# Patient Record
Sex: Male | Born: 1970 | Race: Black or African American | Hispanic: No | Smoking: Current every day smoker
Health system: Southern US, Community
[De-identification: ages and names within clinical notes are randomized; demographics above are authoritative.]

## PROBLEM LIST (undated history)

## (undated) DIAGNOSIS — M25569 Pain in unspecified knee: Secondary | ICD-10-CM

---

## 1998-07-15 ENCOUNTER — Emergency Department (HOSPITAL_COMMUNITY): Admission: EM | Admit: 1998-07-15 | Discharge: 1998-07-15 | Payer: Self-pay | Admitting: Emergency Medicine

## 1998-07-20 ENCOUNTER — Emergency Department (HOSPITAL_COMMUNITY): Admission: EM | Admit: 1998-07-20 | Discharge: 1998-07-20 | Payer: Self-pay

## 2003-12-21 ENCOUNTER — Emergency Department (HOSPITAL_COMMUNITY): Admission: EM | Admit: 2003-12-21 | Discharge: 2003-12-21 | Payer: Self-pay | Admitting: Emergency Medicine

## 2005-02-17 ENCOUNTER — Emergency Department (HOSPITAL_COMMUNITY): Admission: EM | Admit: 2005-02-17 | Discharge: 2005-02-17 | Payer: Self-pay | Admitting: Emergency Medicine

## 2009-03-18 ENCOUNTER — Emergency Department (HOSPITAL_COMMUNITY): Admission: EM | Admit: 2009-03-18 | Discharge: 2009-03-18 | Payer: Self-pay | Admitting: Emergency Medicine

## 2009-09-12 ENCOUNTER — Emergency Department (HOSPITAL_COMMUNITY): Admission: EM | Admit: 2009-09-12 | Discharge: 2009-09-12 | Payer: Self-pay | Admitting: Emergency Medicine

## 2010-12-28 LAB — CBC
Hemoglobin: 12.3 g/dL — ABNORMAL LOW (ref 13.0–17.0)
MCHC: 31.4 g/dL (ref 30.0–36.0)
MCV: 78.7 fL (ref 78.0–100.0)
Platelets: 238 10*3/uL (ref 150–400)
RDW: 15.1 % (ref 11.5–15.5)
WBC: 7.2 10*3/uL (ref 4.0–10.5)

## 2010-12-28 LAB — HEPATIC FUNCTION PANEL: Bilirubin, Direct: 0.1 mg/dL (ref 0.0–0.3)

## 2010-12-28 LAB — DIFFERENTIAL
Eosinophils Absolute: 0.2 10*3/uL (ref 0.0–0.7)
Eosinophils Relative: 3 % (ref 0–5)
Lymphocytes Relative: 40 % (ref 12–46)
Monocytes Absolute: 0.7 10*3/uL (ref 0.1–1.0)
Monocytes Relative: 9 % (ref 3–12)

## 2010-12-28 LAB — POCT I-STAT, CHEM 8
Calcium, Ion: 1.19 mmol/L (ref 1.12–1.32)
Glucose, Bld: 77 mg/dL (ref 70–99)
Hemoglobin: 15 g/dL (ref 13.0–17.0)
TCO2: 26 mmol/L (ref 0–100)

## 2011-01-22 ENCOUNTER — Emergency Department (HOSPITAL_COMMUNITY): Payer: Self-pay

## 2011-01-22 ENCOUNTER — Emergency Department (HOSPITAL_COMMUNITY)
Admission: EM | Admit: 2011-01-22 | Discharge: 2011-01-22 | Disposition: A | Discharge status: Home / Self Care | Payer: Self-pay | Attending: Emergency Medicine | Admitting: Emergency Medicine

## 2011-01-22 DIAGNOSIS — S6990XA Unspecified injury of unspecified wrist, hand and finger(s), initial encounter: Secondary | ICD-10-CM | POA: Insufficient documentation

## 2011-01-22 DIAGNOSIS — W19XXXA Unspecified fall, initial encounter: Secondary | ICD-10-CM | POA: Insufficient documentation

## 2011-01-22 DIAGNOSIS — S59909A Unspecified injury of unspecified elbow, initial encounter: Secondary | ICD-10-CM | POA: Insufficient documentation

## 2011-01-22 DIAGNOSIS — M79609 Pain in unspecified limb: Secondary | ICD-10-CM | POA: Insufficient documentation

## 2011-01-22 DIAGNOSIS — M25579 Pain in unspecified ankle and joints of unspecified foot: Secondary | ICD-10-CM | POA: Insufficient documentation

## 2011-01-22 DIAGNOSIS — Y92009 Unspecified place in unspecified non-institutional (private) residence as the place of occurrence of the external cause: Secondary | ICD-10-CM | POA: Insufficient documentation

## 2011-01-22 DIAGNOSIS — M25539 Pain in unspecified wrist: Secondary | ICD-10-CM | POA: Insufficient documentation

## 2011-03-16 ENCOUNTER — Emergency Department (HOSPITAL_COMMUNITY): Payer: Self-pay

## 2011-03-16 ENCOUNTER — Emergency Department (HOSPITAL_COMMUNITY)
Admission: EM | Admit: 2011-03-16 | Discharge: 2011-03-16 | Disposition: A | Discharge status: Home / Self Care | Payer: Self-pay | Attending: Emergency Medicine | Admitting: Emergency Medicine

## 2011-03-16 DIAGNOSIS — M25579 Pain in unspecified ankle and joints of unspecified foot: Secondary | ICD-10-CM | POA: Insufficient documentation

## 2011-03-16 DIAGNOSIS — S838X9A Sprain of other specified parts of unspecified knee, initial encounter: Secondary | ICD-10-CM | POA: Insufficient documentation

## 2011-03-16 DIAGNOSIS — M25569 Pain in unspecified knee: Secondary | ICD-10-CM | POA: Insufficient documentation

## 2011-03-22 ENCOUNTER — Emergency Department (HOSPITAL_COMMUNITY)
Admission: EM | Admit: 2011-03-22 | Discharge: 2011-03-22 | Disposition: A | Discharge status: Home / Self Care | Payer: Self-pay | Attending: Emergency Medicine | Admitting: Emergency Medicine

## 2011-03-22 DIAGNOSIS — IMO0002 Reserved for concepts with insufficient information to code with codable children: Secondary | ICD-10-CM | POA: Insufficient documentation

## 2011-03-22 DIAGNOSIS — M25569 Pain in unspecified knee: Secondary | ICD-10-CM | POA: Insufficient documentation

## 2011-03-22 DIAGNOSIS — X58XXXA Exposure to other specified factors, initial encounter: Secondary | ICD-10-CM | POA: Insufficient documentation

## 2011-04-27 ENCOUNTER — Ambulatory Visit: Payer: Self-pay | Attending: Orthopedic Surgery

## 2011-06-08 ENCOUNTER — Emergency Department (HOSPITAL_COMMUNITY)
Admission: EM | Admit: 2011-06-08 | Discharge: 2011-06-08 | Disposition: A | Discharge status: Home / Self Care | Payer: Self-pay | Attending: Emergency Medicine | Admitting: Emergency Medicine

## 2011-06-08 ENCOUNTER — Emergency Department (HOSPITAL_COMMUNITY): Payer: Self-pay

## 2011-06-08 DIAGNOSIS — X58XXXA Exposure to other specified factors, initial encounter: Secondary | ICD-10-CM | POA: Insufficient documentation

## 2011-06-08 DIAGNOSIS — K047 Periapical abscess without sinus: Secondary | ICD-10-CM | POA: Insufficient documentation

## 2011-06-08 DIAGNOSIS — R6884 Jaw pain: Secondary | ICD-10-CM | POA: Insufficient documentation

## 2011-06-08 DIAGNOSIS — S025XXA Fracture of tooth (traumatic), initial encounter for closed fracture: Secondary | ICD-10-CM | POA: Insufficient documentation

## 2011-11-03 ENCOUNTER — Emergency Department (HOSPITAL_COMMUNITY)
Admission: EM | Admit: 2011-11-03 | Discharge: 2011-11-04 | Disposition: A | Discharge status: Home / Self Care | Payer: Self-pay | Attending: Emergency Medicine | Admitting: Emergency Medicine

## 2011-11-03 ENCOUNTER — Emergency Department (HOSPITAL_COMMUNITY): Payer: Self-pay

## 2011-11-03 ENCOUNTER — Encounter (HOSPITAL_COMMUNITY): Payer: Self-pay

## 2011-11-03 DIAGNOSIS — S0990XA Unspecified injury of head, initial encounter: Secondary | ICD-10-CM | POA: Insufficient documentation

## 2011-11-03 DIAGNOSIS — M25519 Pain in unspecified shoulder: Secondary | ICD-10-CM | POA: Insufficient documentation

## 2011-11-03 DIAGNOSIS — S0003XA Contusion of scalp, initial encounter: Secondary | ICD-10-CM | POA: Insufficient documentation

## 2011-11-03 DIAGNOSIS — M542 Cervicalgia: Secondary | ICD-10-CM | POA: Insufficient documentation

## 2011-11-03 DIAGNOSIS — R209 Unspecified disturbances of skin sensation: Secondary | ICD-10-CM | POA: Insufficient documentation

## 2011-11-03 DIAGNOSIS — F10929 Alcohol use, unspecified with intoxication, unspecified: Secondary | ICD-10-CM

## 2011-11-03 DIAGNOSIS — F17200 Nicotine dependence, unspecified, uncomplicated: Secondary | ICD-10-CM | POA: Insufficient documentation

## 2011-11-03 DIAGNOSIS — R51 Headache: Secondary | ICD-10-CM | POA: Insufficient documentation

## 2011-11-03 DIAGNOSIS — F1010 Alcohol abuse, uncomplicated: Secondary | ICD-10-CM | POA: Insufficient documentation

## 2011-11-03 DIAGNOSIS — IMO0002 Reserved for concepts with insufficient information to code with codable children: Secondary | ICD-10-CM | POA: Insufficient documentation

## 2011-11-03 DIAGNOSIS — S0100XA Unspecified open wound of scalp, initial encounter: Secondary | ICD-10-CM | POA: Insufficient documentation

## 2011-11-03 DIAGNOSIS — M549 Dorsalgia, unspecified: Secondary | ICD-10-CM | POA: Insufficient documentation

## 2011-11-03 LAB — URINALYSIS, ROUTINE W REFLEX MICROSCOPIC
Glucose, UA: NEGATIVE mg/dL
Hgb urine dipstick: NEGATIVE
Ketones, ur: NEGATIVE mg/dL
Leukocytes, UA: NEGATIVE
pH: 6 (ref 5.0–8.0)

## 2011-11-03 LAB — CBC
Hemoglobin: 12.8 g/dL — ABNORMAL LOW (ref 13.0–17.0)
MCHC: 32.4 g/dL (ref 30.0–36.0)
RBC: 5.23 MIL/uL (ref 4.22–5.81)
RDW: 15.2 % (ref 11.5–15.5)
WBC: 8.1 10*3/uL (ref 4.0–10.5)

## 2011-11-03 LAB — DIFFERENTIAL
Basophils Absolute: 0 10*3/uL (ref 0.0–0.1)
Eosinophils Absolute: 0.2 10*3/uL (ref 0.0–0.7)
Eosinophils Relative: 2 % (ref 0–5)
Lymphocytes Relative: 44 % (ref 12–46)
Neutrophils Relative %: 50 % (ref 43–77)

## 2011-11-03 LAB — COMPREHENSIVE METABOLIC PANEL
ALT: 14 U/L (ref 0–53)
Alkaline Phosphatase: 43 U/L (ref 39–117)
BUN: 10 mg/dL (ref 6–23)
Chloride: 106 mEq/L (ref 96–112)
GFR calc Af Amer: >90 mL/min (ref >90)
Glucose, Bld: 88 mg/dL (ref 70–99)
Potassium: 3.5 mEq/L (ref 3.5–5.1)
Sodium: 143 mEq/L (ref 135–145)
Total Bilirubin: 0.2 mg/dL — ABNORMAL LOW (ref 0.3–1.2)
Total Protein: 7.1 g/dL (ref 6.0–8.3)

## 2011-11-03 LAB — PROTIME-INR: Prothrombin Time: 12.6 seconds (ref 11.6–15.2)

## 2011-11-03 MED ORDER — IOHEXOL 300 MG/ML  SOLN
100.0000 mL | Freq: Once | INTRAMUSCULAR | Status: AC | PRN
  Administered 2011-11-03: 100 mL via INTRAVENOUS

## 2011-11-03 MED ORDER — SODIUM CHLORIDE 0.9 % IV BOLUS (SEPSIS)
1000.0000 mL | Freq: Once | INTRAVENOUS | Status: AC
  Administered 2011-11-03: 1000 mL via INTRAVENOUS

## 2011-11-03 MED ORDER — TETANUS-DIPHTH-ACELL PERTUSSIS 5-2.5-18.5 LF-MCG/0.5 IM SUSP
0.5000 mL | Freq: Once | INTRAMUSCULAR | Status: AC
  Administered 2011-11-03: 0.5 mL via INTRAMUSCULAR
  Filled 2011-11-03: qty 0.5

## 2011-11-03 MED ORDER — HYDROCODONE-ACETAMINOPHEN 5-325 MG PO TABS
ORAL_TABLET | ORAL | Status: AC
  Filled 2011-11-03: qty 2

## 2011-11-03 MED ORDER — FENTANYL CITRATE 0.05 MG/ML IJ SOLN
100.0000 ug | Freq: Once | INTRAMUSCULAR | Status: AC
  Administered 2011-11-03: 100 ug via INTRAVENOUS
  Filled 2011-11-03: qty 2

## 2011-11-03 MED ORDER — HYDROCODONE-ACETAMINOPHEN 5-325 MG PO TABS
2.0000 | ORAL_TABLET | Freq: Once | ORAL | Status: AC
  Administered 2011-11-03: 2 via ORAL

## 2011-11-03 MED ORDER — ONDANSETRON HCL 4 MG/2ML IJ SOLN
4.0000 mg | Freq: Once | INTRAMUSCULAR | Status: AC
  Administered 2011-11-03: 4 mg via INTRAVENOUS
  Filled 2011-11-03: qty 2

## 2011-11-03 NOTE — ED Notes (Signed)
Pt ambulated without assistance and ginger ale given
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
## 2011-11-03 NOTE — ED Notes (Signed)
Lacerations noted to mid-forehead and R side of forehead, hematoma with laceration noted to occipital area.  Large swelling noted to L scapula.
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
## 2011-11-03 NOTE — ED Notes (Signed)
[employer] at bedside to speak with pt.  

## 2012-04-03 ENCOUNTER — Emergency Department (HOSPITAL_COMMUNITY)
Admission: EM | Admit: 2012-04-03 | Discharge: 2012-04-03 | Disposition: A | Discharge status: Home / Self Care | Payer: Self-pay | Attending: Emergency Medicine | Admitting: Emergency Medicine

## 2012-04-03 ENCOUNTER — Encounter (HOSPITAL_COMMUNITY): Payer: Self-pay | Admitting: *Deleted

## 2012-04-03 DIAGNOSIS — F17200 Nicotine dependence, unspecified, uncomplicated: Secondary | ICD-10-CM | POA: Insufficient documentation

## 2012-04-03 DIAGNOSIS — Z4802 Encounter for removal of sutures: Secondary | ICD-10-CM | POA: Insufficient documentation

## 2012-04-03 NOTE — ED Provider Notes (Signed)
Medical screening examination/treatment/procedure(s) were performed by non-physician practitioner and as supervising physician I was immediately available for consultation/collaboration.  Doug Sou, MD 04/03/12 [phone]
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
## 2012-04-03 NOTE — ED Provider Notes (Signed)
History     CSN: [id number]  Arrival date & time 04/03/12  1125   First MD Initiated Contact with Patient 04/03/12 1318      Chief Complaint  Patient presents with  . Suture / Staple Removal    (Consider location/radiation/quality/duration/timing/severity/associated sxs/prior treatment) Patient is a 41 y.o. male presenting with suture removal. The history is provided by the patient.  Suture / Staple Removal  Treated in ED: 5 months ago. There has been no treatment since the wound repair. Fever duration: no fever. There has been no drainage from the wound. There is no redness present. There is no swelling present. The pain has no pain.    History reviewed. No pertinent past medical history.  History reviewed. No pertinent past surgical history.  No family history on file.  History  Substance Use Topics  . Smoking status: Current Everyday Smoker -- 0.5 packs/day  . Smokeless tobacco: Not on file  . Alcohol Use: 25.2 oz/week    42 Cans of beer per week      Review of Systems  Constitutional: Negative for fever and chills.  Gastrointestinal: Negative for nausea and vomiting.  Skin: Negative for color change.    Allergies  Review of patient's allergies indicates no known allergies.  Home Medications   Current Outpatient Rx  Name Route Sig Dispense Refill  . ACETAMINOPHEN 500 MG PO TABS Oral Take 1,000 mg by mouth every 6 (six) hours as needed. For pain      BP 136/66  Pulse 75  Temp 98.4 F (36.9 C) (Oral)  Resp 19  Wt 165 lb (74.844 kg)  SpO2 100%  Physical Exam  Nursing note and vitals reviewed. Constitutional: He appears well-developed and well-nourished.  HENT:  Head: Normocephalic and atraumatic.  Neck: Normal range of motion. Neck supple.  Cardiovascular: Normal rate, regular rhythm and normal heart sounds.   Pulmonary/Chest: Effort normal and breath sounds normal.  Neurological: He is alert.  Skin: Skin is warm and dry.     Psychiatric: He has  a normal mood and affect.    ED Course  Procedures (including critical care time)  Labs Reviewed - No data to display No results found.   No diagnosis found.    MDM  Patient presenting for staple removal.  Staples removed without difficulty.  No surrounding erythema, edema, or induration.  Area nontender to palpation.  Patient afebrile.  Therefore, patient discharged home.        Pascal Lux Glenaire, PA-C 04/03/12 1537

## 2014-08-08 ENCOUNTER — Emergency Department (HOSPITAL_COMMUNITY)
Admission: EM | Admit: 2014-08-08 | Discharge: 2014-08-08 | Disposition: A | Discharge status: Home / Self Care | Payer: Self-pay | Attending: Emergency Medicine | Admitting: Emergency Medicine

## 2014-08-08 ENCOUNTER — Encounter (HOSPITAL_COMMUNITY): Payer: Self-pay | Admitting: Emergency Medicine

## 2014-08-08 DIAGNOSIS — Z720 Tobacco use: Secondary | ICD-10-CM | POA: Insufficient documentation

## 2014-08-08 DIAGNOSIS — M25562 Pain in left knee: Secondary | ICD-10-CM | POA: Insufficient documentation

## 2014-08-08 DIAGNOSIS — M1990 Unspecified osteoarthritis, unspecified site: Secondary | ICD-10-CM | POA: Insufficient documentation

## 2014-08-08 HISTORY — DX: Pain in unspecified knee: M25.569

## 2014-08-08 MED ORDER — NAPROXEN 500 MG PO TABS: 500.0000 mg | ORAL_TABLET | Freq: Two times a day (BID) | ORAL | Status: DC

## 2014-08-08 MED ORDER — IBUPROFEN 800 MG PO TABS
800.0000 mg | ORAL_TABLET | Freq: Once | ORAL | Status: AC
  Administered 2014-08-08: 800 mg via ORAL
  Filled 2014-08-08: qty 1

## 2014-08-08 MED ORDER — METHOCARBAMOL 500 MG PO TABS: 500.0000 mg | ORAL_TABLET | Freq: Two times a day (BID) | ORAL | Status: DC

## 2014-08-08 NOTE — Progress Notes (Signed)
[facility] Stacy,  ° °Provided pt with a list of primary care resources and a [facility] [employer] [facility] application to help patient establish primary care.  °

## 2014-08-08 NOTE — Discharge Instructions (Signed)
Arthralgia °Your caregiver has diagnosed you as suffering from an arthralgia. Arthralgia means there is pain in a joint. This can come from many reasons including: °· Bruising the joint which causes soreness (inflammation) in the joint. °· Wear and tear on the joints which occur as we grow older (osteoarthritis). °· Overusing the joint. °· Various forms of arthritis. °· Infections of the joint. °Regardless of the cause of pain in your joint, most of these different pains respond to anti-inflammatory drugs and rest. The exception to this is when a joint is infected, and these cases are treated with antibiotics, if it is a bacterial infection. °HOME CARE INSTRUCTIONS  °· Rest the injured area for as long as directed by your caregiver. Then slowly start using the joint as directed by your caregiver and as the pain allows. Crutches as directed may be useful if the ankles, knees or hips are involved. If the knee was splinted or casted, continue use and care as directed. If an stretchy or elastic wrapping bandage has been applied today, it should be removed and re-applied every 3 to 4 hours. It should not be applied tightly, but firmly enough to keep swelling down. Watch toes and feet for swelling, bluish discoloration, coldness, numbness or excessive pain. If any of these problems (symptoms) occur, remove the ace bandage and re-apply more loosely. If these symptoms persist, contact your caregiver or return to this location. °· For the first 24 hours, keep the injured extremity elevated on pillows while lying down. °· Apply ice for 15-20 minutes to the sore joint every couple hours while awake for the first half day. Then 03-04 times per day for the first 48 hours. Put the ice in a plastic bag and place a towel between the bag of ice and your skin. °· Wear any splinting, casting, elastic bandage applications, or slings as instructed. °· Only take over-the-counter or prescription medicines for pain, discomfort, or fever as  directed by your caregiver. Do not use aspirin immediately after the injury unless instructed by your physician. Aspirin can cause increased bleeding and bruising of the tissues. °· If you were given crutches, continue to use them as instructed and do not resume weight bearing on the sore joint until instructed. °Persistent pain and inability to use the sore joint as directed for more than 2 to 3 days are warning signs indicating that you should see a caregiver for a follow-up visit as soon as possible. Initially, a hairline fracture (break in bone) may not be evident on X-rays. Persistent pain and swelling indicate that further evaluation, non-weight bearing or use of the joint (use of crutches or slings as instructed), or further X-rays are indicated. X-rays may sometimes not show a small fracture until a week or 10 days later. Make a follow-up appointment with your own caregiver or one to whom we have referred you. A radiologist (specialist in reading X-rays) may read your X-rays. Make sure you know how you are to obtain your X-ray results. Do not assume everything is normal if you do not hear from us. °SEEK MEDICAL CARE IF: °Bruising, swelling, or pain increases. °SEEK IMMEDIATE MEDICAL CARE IF:  °· Your fingers or toes are numb or blue. °· The pain is not responding to medications and continues to stay the same or get worse. °· The pain in your joint becomes severe. °· You develop a fever over 102° F (38.9° C). °· It becomes impossible to move or use the joint. °MAKE SURE YOU:  °·   Understand these instructions.  Will watch your condition.  Will get help right away if you are not doing well or get worse. Document Released: 09/13/2005 Document Revised: 12/06/2011 Document Reviewed: 05/01/2008 [facility] Patient Information 2015 [place], [place]. This information is not intended to replace advice given to you by your health care provider. Make sure you discuss any questions you have with your health care  provider.  Elastic Bandage and RICE Elastic bandages come in different shapes and sizes. They perform different functions. Your caregiver will help you to decide what is best for your protection, recovery, or rehabilitation following an injury. The following are some general tips to help you use an elastic bandage.  Use the bandage as directed by the maker of the bandage you are using.  Do not wrap it too tight. This may cut off the circulation of the arm or leg below the bandage.  If part of your body beyond the bandage becomes blue, numb, or swollen, it is too tight. Loosen the bandage as needed to prevent these problems.  See your caregiver or trainer if the bandage seems to be making your problems worse rather than better. Bandages may be a reminder to you that you have an injury. However, they provide very little support. The few pounds of support they provide are minor considering the pressure it takes to injure a joint or tear ligaments. Therefore, the joint will not be able to handle all of the wear and tear it could before the injury. The routine care of many injuries includes Rest, Ice, Compression, and Elevation (RICE).  Rest is required to allow your body to heal. Generally, routine activities can be resumed when comfortable. Injured tendons and bones take about 6 weeks to heal.  Icing the injury helps keep the swelling down and reduces pain. Do not apply ice directly to the skin. Put ice in a plastic bag. Place a towel between the skin and the bag. This will prevent frostbite to the skin. Apply ice bags to the injured area for 15-20 minutes, every 2 hours while awake. Do this for the first 24 to 48 hours, then as directed by your caregiver.  Compression helps keep swelling down, gives support, and helps with discomfort. If an elastic bandage has been applied today, it should be removed and reapplied every 3 to 4 hours. It should not be applied tightly, but firmly enough to keep  swelling down. Watch fingers or toes for swelling, bluish discoloration, coldness, numbness, or increased pain. If any of these problems occur, remove the bandage and reapply it more loosely. If these problems persist, contact your caregiver.  Elevation helps reduce swelling and decreases pain. The injured area (arms, hands, legs, or feet) should be placed near to or above the heart (center of the chest) if able. Persistent pain and inability to use the injured area for more than 2 to 3 days are warning signs. You should see a caregiver for a follow-up visit as soon as possible. Initially, a minor broken bone (hairline fracture) may not be seen on X-rays. It may take 7 to 10 days to finally show up. Continued pain and swelling show that further evaluation and/or X-rays are needed. Make a follow-up visit with your caregiver. A specialist in reading X-rays (radiologist) will read your X-rays again. Finding out the results of your test Not all test results are available during your visit. If your test results are not back during the visit, make an appointment with your caregiver to  find out the results. Do not assume everything is normal if you have not heard from your caregiver or the medical facility. It is important for you to follow up on all of your test results. Document Released: 03/05/2002 Document Revised: 12/06/2011 Document Reviewed: 01/15/2008 [facility] Patient Information 2015 [place], [place]. This information is not intended to replace advice given to you by your health care provider. Make sure you discuss any questions you have with your health care provider.

## 2014-08-08 NOTE — ED Notes (Signed)
Pt reports increased pain and swelling  in l/knee x 2 weeks. Hx of similar pain. 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

## 2014-08-08 NOTE — ED Provider Notes (Signed)
CSN: [id number]     Arrival date & time 08/08/14  6629 History   First MD Initiated Contact with Patient 08/08/14 1028     Chief Complaint  Patient presents with  . Knee Pain    2 week hx of l/knee pain  . Joint Swelling     (Consider location/radiation/quality/duration/timing/severity/associated sxs/prior Treatment) HPI  43 year old male here with c/o L knee pain and swelling x 2 weeks.  Has similar episodes in the past, which usually lasting for 2-3 weeks and usually bilaterally.  This time it is only affecting L knee, and more intense.  Report pain as sharp, to the medial and posterior aspect of knee, worse with flexion/extension and mildly improves with tylenol.  Report stiffness with lack of activity, usually last <30 min.  Denies known hx of arthritis, denies other joint pain.  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  Denies fever or chills, no known trauma, no prior knee surgery or recent hospitalization.  Report bilat shoulder pain which is chronic and related to his job.     Past Medical History  Diagnosis Date  . Knee joint pain    History reviewed. No pertinent past surgical history. History reviewed. No pertinent family history. History  Substance Use Topics  . Smoking status: Current Every Day Smoker -- 0.50 packs/day  . Smokeless tobacco: Not on file  . Alcohol Use: 1.8 oz/week    3 Cans of beer per week     Comment: daily    Review of Systems  Constitutional: Negative for fever.  Musculoskeletal: Positive for joint swelling and arthralgias.  Skin: Negative for wound.  Neurological: Negative for numbness.      Allergies  Review of patient's allergies indicates no known allergies.  Home Medications   Prior to Admission medications   Medication Sig Start Date End Date Taking? Authorizing Provider  acetaminophen (TYLENOL) 500 MG tablet Take 1,000 mg by mouth every 6 (six) hours as needed. For pain    Historical Provider, MD    BP 134/75 mmHg  Pulse 65  Temp(Src) 98.5 F (36.9 C) (Oral)  Resp 18  SpO2 100% Physical Exam  Constitutional: He appears well-developed and well-nourished. No distress.  HENT:  Head: Atraumatic.  Eyes: Conjunctivae are normal.  Neck: Normal range of motion. Neck supple.  Cardiovascular: Intact distal pulses.   Musculoskeletal: He exhibits tenderness (L knee: tenderness to medial and lateral aspect of patella, and popbliteal fossa to both flexion/extension but with FROM.  no effusion, erythema, or limited ROM.  ). He exhibits no edema.  BLE: no palpable cords, erythema, edema.  L hip and L ankle with FROM, nontender.  L knee: FROM, negative anterior/posterior drawer test.  Pain with varus/valgus but no joint laxity.  No evidence of bursitis or cellulitis.  Knee mildly effused when compared to R knee.    Neurological: He is alert.  Skin: No rash noted.  Psychiatric: He has a normal mood and affect.    ED Course  Procedures (including critical care time)  11:18 AM  Pt presents with L knee pain, recurrent and chronic in nature likely 2/2 job as Animator.  No evidence to suggest septic joint.  No hx of gout, no trauma to require advance imaging.  No joint laxity. Pt is NVI.  Recommend NSAIDs, RICE therapy, knee compression and to f/u with orthopedic for further management.  Return precaution discussed.     Labs Review Labs Reviewed - No  data to display  Imaging Review No results found.   EKG Interpretation None      MDM   Final diagnoses:  Knee pain, left  Arthritis    BP 134/75 mmHg  Pulse 65  Temp(Src) 98.5 F (36.9 C) (Oral)  Resp 18  SpO2 100%     Domenic Moras, PA-C 08/08/14 [place], MD 08/14/14 1252

## 2017-03-05 ENCOUNTER — Emergency Department (HOSPITAL_COMMUNITY): Payer: Self-pay

## 2017-03-05 ENCOUNTER — Emergency Department (HOSPITAL_COMMUNITY)
Admission: EM | Admit: 2017-03-05 | Discharge: 2017-03-05 | Disposition: A | Discharge status: Home / Self Care | Payer: Self-pay | Attending: Emergency Medicine | Admitting: Emergency Medicine

## 2017-03-05 ENCOUNTER — Emergency Department (HOSPITAL_BASED_OUTPATIENT_CLINIC_OR_DEPARTMENT_OTHER)
Admission: EM | Admit: 2017-03-05 | Discharge: 2017-03-05 | Disposition: A | Discharge status: Home / Self Care | Payer: Self-pay | Source: Home / Self Care | Attending: Emergency Medicine | Admitting: Emergency Medicine

## 2017-03-05 ENCOUNTER — Encounter (HOSPITAL_COMMUNITY): Payer: Self-pay | Admitting: Emergency Medicine

## 2017-03-05 DIAGNOSIS — F17200 Nicotine dependence, unspecified, uncomplicated: Secondary | ICD-10-CM | POA: Insufficient documentation

## 2017-03-05 DIAGNOSIS — M25462 Effusion, left knee: Secondary | ICD-10-CM | POA: Insufficient documentation

## 2017-03-05 DIAGNOSIS — M7989 Other specified soft tissue disorders: Secondary | ICD-10-CM

## 2017-03-05 DIAGNOSIS — M25461 Effusion, right knee: Secondary | ICD-10-CM | POA: Insufficient documentation

## 2017-03-05 DIAGNOSIS — M25561 Pain in right knee: Secondary | ICD-10-CM | POA: Insufficient documentation

## 2017-03-05 DIAGNOSIS — M25562 Pain in left knee: Secondary | ICD-10-CM | POA: Insufficient documentation

## 2017-03-05 MED ORDER — NAPROXEN 500 MG PO TABS
500.0000 mg | ORAL_TABLET | Freq: Once | ORAL | Status: AC
  Administered 2017-03-05: 500 mg via ORAL
  Filled 2017-03-05: qty 1

## 2017-03-05 MED ORDER — NAPROXEN 500 MG PO TABS: 500.0000 mg | ORAL_TABLET | Freq: Two times a day (BID) | ORAL | 0 refills | Status: AC

## 2017-03-05 NOTE — ED Provider Notes (Signed)
[place] DEPT Provider Note   CSN: [id number] Arrival date & time: 03/05/17  1357     History   Chief Complaint Chief Complaint  Patient presents with  . Leg Swelling    HPI Lawrence Larsen is a 46 y.o. male.  Patient with several month history of bilateral knee pain worse lately. Swelling to the right knee swelling to both legs. No shortness of breath no chest pain. 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 Patient 2015 was seen for bilateral knee pain then but has not had any sniffing a problems since that time.      Past Medical History:  Diagnosis Date  . Knee joint pain     There are no active problems to display for this patient.   No past surgical history on file.     Home Medications    Prior to Admission medications   Medication Sig Start Date End Date Taking? Authorizing Provider  acetaminophen (TYLENOL) 500 MG tablet Take 1,000 mg by mouth every 6 (six) hours as needed. For pain   Yes [provider]  ibuprofen (ADVIL,MOTRIN) 200 MG tablet Take 400 mg by mouth every 6 (six) hours as needed for mild pain or moderate pain.   Yes [provider]  naproxen (NAPROSYN) 500 MG tablet Take 1 tablet (500 mg total) by mouth 2 (two) times daily. 03/05/17   Fredia Sorrow, MD    Family History No family history on file.  Social History Social History  Substance Use Topics  . Smoking status: Current Every Day Smoker    Packs/day: 0.50  . Smokeless tobacco: Not on file  . Alcohol use 1.8 oz/week    3 Cans of beer per week     Comment: daily     Allergies   Patient has no known allergies.   Review of Systems Review of Systems  Constitutional: Negative for fever.  HENT: Negative for congestion.   Eyes: Negative for redness.  Respiratory: Negative for shortness of breath.   Cardiovascular: Positive for leg swelling. Negative for chest pain.  Gastrointestinal: Negative for abdominal pain.    Genitourinary: Negative for hematuria.  Musculoskeletal: Positive for arthralgias. Negative for back pain.  Neurological: Negative for weakness.  Hematological: Does not bruise/bleed easily.  Psychiatric/Behavioral: Negative for confusion.     Physical Exam Updated Vital Signs BP 110/78 (BP Location: Left Arm)   Pulse 74   Temp 98.3 F (36.8 C) (Oral)   Resp 16   Ht 1.651 m (5\' 5" )   Wt 81.6 kg (180 lb)   SpO2 95%   BMI 29.95 kg/m   Physical Exam  Constitutional: He is oriented to person, place, and time. He appears well-developed and well-nourished. No distress.  HENT:  Head: Normocephalic and atraumatic.  Mouth/Throat: Oropharynx is clear and moist.  Eyes: Conjunctivae and EOM are normal. Pupils are equal, round, and reactive to light.  Neck: Normal range of motion. Neck supple.  Cardiovascular: Normal rate and normal heart sounds.   Pulmonary/Chest: Effort normal and breath sounds normal.  Abdominal: Soft. Bowel sounds are normal. There is no tenderness.  Musculoskeletal: Normal range of motion. He exhibits edema.  Bilateral knee effusions right greater than left. Edema to both legs. Pain with range of motion of the legs at the knee.  Neurological: He is alert and oriented to person, place, and time. No cranial nerve deficit or sensory deficit. He exhibits normal muscle tone. Coordination normal.  Skin:  Skin is warm.  Nursing note and vitals reviewed.    ED Treatments / Results  Labs (all labs ordered are listed, but only abnormal results are displayed) Labs Reviewed - No data to display  EKG  EKG Interpretation None       Radiology Dg Knee Complete 4 Views Left  Result Date: 03/05/2017 CLINICAL DATA:  Left knee pain and swelling for 6-7 months. EXAM: LEFT KNEE - COMPLETE 4+ VIEW COMPARISON:  01/22/2011 radiographs FINDINGS: There is no evidence of acute fracture or dislocation. A moderate to large knee effusion is present. Joint space narrowing osteophytosis  of both mediolateral compartments noted. No focal bony lesions are present. IMPRESSION: Moderate to large knee effusion.  No acute bony abnormality. Mild degenerative changes within the medial and lateral compartments Electronically Signed   By: Margarette Canada M.D.   On: 03/05/2017 15:29   Dg Knee Complete 4 Views Right  Result Date: 03/05/2017 CLINICAL DATA:  Pain and swelling EXAM: RIGHT KNEE - COMPLETE 4+ VIEW COMPARISON:  March 16, 2011 FINDINGS: Frontal, lateral, and bilateral oblique views were obtained. There is no fracture or dislocation. There is a sizable joint effusion. There is no appreciable joint space narrowing. There is mild spurring medially. There is a benign exostosis arising from the distal femoral metaphysis medially measuring 1.4 x 0.7 cm. No erosive change. IMPRESSION: Sizable joint effusion. Slight spurring medially without appreciable joint space narrowing. Benign exostosis arising from the medial distal femur. No fracture or dislocation. Electronically Signed   By: Lowella Grip III M.D.   On: 03/05/2017 15:26    Procedures Procedures (including critical care time)  Medications Ordered in ED Medications  naproxen (NAPROSYN) tablet 500 mg (500 mg Oral Given 03/05/17 1535)     Initial Impression / Assessment and Plan / ED Course  I have reviewed the triage vital signs and the nursing notes.  Pertinent labs & imaging results that were available during my care of the patient were reviewed by me and considered in my medical decision making (see chart for details).     Patient with bilateral knee pain bilateral leg swelling. Doppler study showed no evidence of deep vein thrombosis. Behind the right knee he does have a Baker's cyst and a fairly large effusion. X-ray show bilateral knee effusions. Patient works by crawling in the crawl space is frequently. We'll going treat with bilateral knee immobilizers anti-inflammatories and follow-up with orthopedics in a work note to be  out of work for a week.  Final Clinical Impressions(s) / ED Diagnoses   Final diagnoses:  Leg swelling  Bilateral knee effusions    New Prescriptions New Prescriptions   NAPROXEN (NAPROSYN) 500 MG TABLET    Take 1 tablet (500 mg total) by mouth 2 (two) times daily.     Fredia Sorrow, MD 03/05/17 [phone]

## 2017-03-05 NOTE — ED Notes (Signed)
PT DISCHARGED. INSTRUCTIONS AND PRESCRIPTION GIVEN. AAOX4. PT IN NO APPARENT DISTRESS WITH MILD PAIN. THE OPPORTUNITY TO ASK QUESTIONS WAS PROVIDED. 

## 2017-03-05 NOTE — ED Triage Notes (Signed)
He reports bilat. Knee pain and swelling waxing and waning for "months". He is here today with c/o worsening bilat. Knee pain and swelling of lower legs R > L.

## 2017-03-05 NOTE — Discharge Instructions (Signed)
Knee immobilizer for comfort. Take the Naprosyn as directed. Follow-up with orthopedics. Work note provided.

## 2017-03-05 NOTE — Progress Notes (Signed)
*  PRELIMINARY RESULTS* Vascular Ultrasound Bilateral lower extremity venous duplex has been completed.  Preliminary findings: No evidence of deep vein thrombosis or baker's cyst on the left.  Fluid collection seen anterior right knee (joint effusion) and posterior right knee(bakers cyst).  Preliminary results given to Dr. Rogene Houston @ 15:55   Everrett Coombe 03/05/2017, 3:54 PM

## 2017-03-09 ENCOUNTER — Ambulatory Visit (INDEPENDENT_AMBULATORY_CARE_PROVIDER_SITE_OTHER): Payer: Self-pay | Admitting: Physician Assistant

## 2017-03-16 ENCOUNTER — Ambulatory Visit (INDEPENDENT_AMBULATORY_CARE_PROVIDER_SITE_OTHER): Payer: Self-pay | Admitting: Physician Assistant

## 2017-03-24 ENCOUNTER — Ambulatory Visit (INDEPENDENT_AMBULATORY_CARE_PROVIDER_SITE_OTHER): Payer: Self-pay | Admitting: Physician Assistant

## 2017-05-06 ENCOUNTER — Encounter (HOSPITAL_COMMUNITY): Payer: Self-pay | Admitting: Emergency Medicine

## 2017-05-06 ENCOUNTER — Emergency Department (HOSPITAL_COMMUNITY): Payer: Self-pay

## 2017-05-06 ENCOUNTER — Emergency Department (HOSPITAL_COMMUNITY)
Admission: EM | Admit: 2017-05-06 | Discharge: 2017-05-06 | Disposition: A | Discharge status: Home / Self Care | Payer: Self-pay | Attending: Emergency Medicine | Admitting: Emergency Medicine

## 2017-05-06 DIAGNOSIS — F17200 Nicotine dependence, unspecified, uncomplicated: Secondary | ICD-10-CM | POA: Insufficient documentation

## 2017-05-06 DIAGNOSIS — K047 Periapical abscess without sinus: Secondary | ICD-10-CM | POA: Insufficient documentation

## 2017-05-06 DIAGNOSIS — Z79899 Other long term (current) drug therapy: Secondary | ICD-10-CM | POA: Insufficient documentation

## 2017-05-06 LAB — CBC WITH DIFFERENTIAL/PLATELET
Basophils Absolute: 0 10*3/uL (ref 0.0–0.1)
Basophils Relative: 0 %
Eosinophils Absolute: 0.1 10*3/uL (ref 0.0–0.7)
Eosinophils Relative: 1 %
HCT: 39.1 % (ref 39.0–52.0)
Hemoglobin: 12.7 g/dL — ABNORMAL LOW (ref 13.0–17.0)
Lymphocytes Relative: 35 %
Lymphs Abs: 3.5 10*3/uL (ref 0.7–4.0)
MCH: 24.2 pg — ABNORMAL LOW (ref 26.0–34.0)
MCHC: 32.5 g/dL (ref 30.0–36.0)
MCV: 74.5 fL — ABNORMAL LOW (ref 78.0–100.0)
Monocytes Absolute: 0.9 10*3/uL (ref 0.1–1.0)
Monocytes Relative: 9 %
Neutro Abs: 5.6 10*3/uL (ref 1.7–7.7)
Neutrophils Relative %: 55 %
Platelets: 263 10*3/uL (ref 150–400)
RBC: 5.25 MIL/uL (ref 4.22–5.81)
RDW: 15.9 % — ABNORMAL HIGH (ref 11.5–15.5)
WBC: 10.1 10*3/uL (ref 4.0–10.5)

## 2017-05-06 LAB — BASIC METABOLIC PANEL
Anion gap: 6 (ref 5–15)
BUN: 11 mg/dL (ref 6–20)
CO2: 26 mmol/L (ref 22–32)
Calcium: 9.4 mg/dL (ref 8.9–10.3)
Chloride: 106 mmol/L (ref 101–111)
Creatinine, Ser: 0.99 mg/dL (ref 0.61–1.24)
GFR calc Af Amer: >60 mL/min (ref >60)
GFR calc non Af Amer: >60 mL/min (ref >60)
Glucose, Bld: 109 mg/dL — ABNORMAL HIGH (ref 65–99)
Potassium: 4.1 mmol/L (ref 3.5–5.1)
Sodium: 138 mmol/L (ref 135–145)

## 2017-05-06 MED ORDER — CLINDAMYCIN PHOSPHATE 600 MG/50ML IV SOLN
600.0000 mg | Freq: Once | INTRAVENOUS | Status: AC
  Administered 2017-05-06: 600 mg via INTRAVENOUS
  Filled 2017-05-06: qty 50

## 2017-05-06 MED ORDER — OXYCODONE-ACETAMINOPHEN 5-325 MG PO TABS: 1.0000 | ORAL_TABLET | ORAL | 0 refills | Status: AC | PRN

## 2017-05-06 MED ORDER — AMOXICILLIN-POT CLAVULANATE 875-125 MG PO TABS: 1.0000 | ORAL_TABLET | Freq: Two times a day (BID) | ORAL | 0 refills | Status: DC

## 2017-05-06 MED ORDER — IOPAMIDOL (ISOVUE-300) INJECTION 61%
INTRAVENOUS | Status: AC
  Filled 2017-05-06: qty 75

## 2017-05-06 MED ORDER — CLINDAMYCIN HCL 150 MG PO CAPS: 450.0000 mg | ORAL_CAPSULE | Freq: Three times a day (TID) | ORAL | 0 refills | Status: DC

## 2017-05-06 MED ORDER — MORPHINE SULFATE (PF) 4 MG/ML IV SOLN
4.0000 mg | Freq: Once | INTRAVENOUS | Status: AC
  Administered 2017-05-06: 4 mg via INTRAVENOUS
  Filled 2017-05-06: qty 1

## 2017-05-06 MED ORDER — AMOXICILLIN-POT CLAVULANATE 875-125 MG PO TABS: 2.0000 | ORAL_TABLET | Freq: Two times a day (BID) | ORAL | 0 refills | Status: DC

## 2017-05-06 MED ORDER — OXYCODONE-ACETAMINOPHEN 5-325 MG PO TABS
1.0000 | ORAL_TABLET | Freq: Once | ORAL | Status: AC
  Administered 2017-05-06: 1 via ORAL
  Filled 2017-05-06: qty 1

## 2017-05-06 MED ORDER — AMOXICILLIN-POT CLAVULANATE 875-125 MG PO TABS: 1.0000 | ORAL_TABLET | Freq: Two times a day (BID) | ORAL | 0 refills | Status: AC

## 2017-05-06 MED ORDER — IOPAMIDOL (ISOVUE-300) INJECTION 61%
75.0000 mL | Freq: Once | INTRAVENOUS | Status: AC | PRN
  Administered 2017-05-06: 75 mL via INTRAVENOUS

## 2017-05-06 NOTE — ED Notes (Signed)
Bed: WTR9 Expected date:  Expected time:  Means of arrival:  Comments: 

## 2017-05-06 NOTE — ED Provider Notes (Signed)
[facility] DEPT Provider Note   CSN: [id number] Arrival date & time: 05/06/17  1102     History   Chief Complaint Chief Complaint  Patient presents with  . Dental Pain    HPI Lawrence Larsen is a 46 y.o. male.  HPI   46 year old male presents today with jaw pain.  Patient reports approximately 1 week ago he started having pain in the right lower jaw along the gumline.  He reports this felt similar to previous dental infections in the past.  Patient notes over the 2 days pain has significantly worsened with worsening swelling spreading back towards the angle of the jaw and down underneath the jaw and into the space under his jaw.  Patient denies any difficulty swallowing or breathing, denies any fever, nausea, vomiting, or decreased range of motion of the neck or jaw.  Patient reports taking Tylenol this morning without significant improvement in his symptoms.  Past Medical History:  Diagnosis Date  . Knee joint pain     There are no active problems to display for this patient.   History reviewed. No pertinent surgical history.     Home Medications    Prior to Admission medications   Medication Sig Start Date End Date Taking? Authorizing Provider  acetaminophen (TYLENOL) 500 MG tablet Take 1,500 mg by mouth every 6 (six) hours as needed. For pain    Yes [provider]  amoxicillin-clavulanate (AUGMENTIN) 875-125 MG tablet Take 1 tablet by mouth every 12 (twelve) hours. 05/06/17   Sangeeta Youse, Dellis Filbert, PA-C  naproxen (NAPROSYN) 500 MG tablet Take 1 tablet (500 mg total) by mouth 2 (two) times daily. 03/05/17   Fredia Sorrow, MD  oxyCODONE-acetaminophen (PERCOCET/ROXICET) 5-325 MG tablet Take 1-2 tablets by mouth every 4 (four) hours as needed for severe pain. 05/06/17   Okey Regal, PA-C    Family History History reviewed. No pertinent family history.  Social History Social History  Substance Use Topics  . Smoking status: Current Every Day Smoker   Packs/day: 0.50  . Smokeless tobacco: Not on file  . Alcohol use 1.8 oz/week    3 Cans of beer per week     Comment: daily     Allergies   Patient has no known allergies.   Review of Systems Review of Systems  All other systems reviewed and are negative.    Physical Exam Updated Vital Signs BP (!) 130/97   Pulse 69   Temp 99.2 F (37.3 C) (Oral)   Resp 16   SpO2 100%   Physical Exam  Constitutional: He is oriented to person, place, and time. He appears well-developed and well-nourished.  HENT:  Head: Normocephalic and atraumatic.  Obvious swelling of the right lower jaw spreading to the angle of the mandible and down into the fascial space under the jaw-no extension into the neck.  No appreciable fluctuance along the gumline, floor the mouth is soft and nontender  Eyes: Pupils are equal, round, and reactive to light. Conjunctivae are normal. Right eye exhibits no discharge. Left eye exhibits no discharge. No scleral icterus.  Neck: Normal range of motion. No JVD present. No tracheal deviation present.  Pulmonary/Chest: Effort normal. No stridor.  Neurological: He is alert and oriented to person, place, and time. Coordination normal.  Psychiatric: He has a normal mood and affect. His behavior is normal. Judgment and thought content normal.  Nursing note and vitals reviewed.    ED Treatments / Results  Labs (all labs ordered are listed, but only abnormal  results are displayed) Labs Reviewed  CBC WITH DIFFERENTIAL/PLATELET - Abnormal; Notable for the following:       Result Value   Hemoglobin 12.7 (*)    MCV 74.5 (*)    MCH 24.2 (*)    RDW 15.9 (*)    All other components within normal limits  BASIC METABOLIC PANEL - Abnormal; Notable for the following:    Glucose, Bld 109 (*)    All other components within normal limits    EKG  EKG Interpretation None       Radiology Ct Maxillofacial W Contrast  Result Date: 05/06/2017 CLINICAL DATA:  46 year old male  with right lower jaw pain and swelling for 3-4 days. Left lower dental problems. EXAM: CT MAXILLOFACIAL WITH CONTRAST TECHNIQUE: Multidetector CT imaging of the maxillofacial structures was performed with intravenous contrast. Multiplanar CT image reconstructions were also generated. CONTRAST:  8mL ISOVUE-300 IOPAMIDOL (ISOVUE-300) INJECTION 61% COMPARISON:  Head and cervical spine CT FINDINGS: Osseous: Abnormal right mandible anterior molar, with periapical lucency, and along the posterior root cortical breakthrough along the superficial body of the mandible (series 4, image 28). Similar poor dentition of the right maxillary wisdom tooth and contralateral left mandible posterior molar. Mandible otherwise intact. Other facial bones intact. Visible skull and cervical spine intact. Multilevel cervical disc and endplate degeneration. Orbits: Orbital walls intact.  Normal orbits soft tissues. Sinuses: Scattered mild to moderate ethmoid sinus mucosal thickening. Trace maxillary and sphenoid sinus mucosal thickening greater on the right. Bilateral tympanic cavities and mastoids are clear. Soft tissues: Moderate to severe soft tissue swelling and stranding about the body of the right mandible. Regional subcutaneous fat stranding. Involvement of the inferior right masticator space. The sublingual and submandibular spaces appear spared although there is asymmetric thickening of the right platysma, with fluid tracking superficial to the platysma toward the submental space. A small hyperenhancing phlegmonous suspected adjacent to the abnormal root of the right mandible anterior molar, but no organized or drainable fluid collection is identified. No subcutaneous gas. Reactive appearing right level 1 and level 2 lymph nodes, mildly enlarged and hyperenhancing (up to 12 mm short axis. No cystic or necrotic nodes. Major vascular structures in the neck including the right IJ remain patent to the skullbase. Negative visualized  thyroid, larynx, pharynx, parapharyngeal spaces, retropharyngeal space, left submandibular and parotid spaces. Visualized scalp soft tissues are within normal limits. Limited intracranial: Negative visualized brain parenchyma. IMPRESSION: 1. Right facial cellulitis with odontogenic source of infection. Abnormal residual right mandible molar. Associated soft tissue phlegmon about the site of right mandible cortical breakthrough, but no abscess or drainable fluid collection. 2. Involvement of the lower right masticator space, but sublingual and other deep soft tissue spaces of the face are spared at this time. 3. Reactive right level 1 and level 2 lymphadenopathy. Electronically Signed   By: Genevie Ann M.D.   On: 05/06/2017 14:59    Procedures Procedures (including critical care time)  Medications Ordered in ED Medications  morphine 4 MG/ML injection 4 mg (4 mg Intravenous Given 05/06/17 1224)  iopamidol (ISOVUE-300) 61 % injection 75 mL (75 mLs Intravenous Contrast Given 05/06/17 1433)  clindamycin (CLEOCIN) IVPB 600 mg (0 mg Intravenous Stopped 05/06/17 1652)  oxyCODONE-acetaminophen (PERCOCET/ROXICET) 5-325 MG per tablet 1 tablet (1 tablet Oral Given 05/06/17 1600)     Initial Impression / Assessment and Plan / ED Course  I have reviewed the triage vital signs and the nursing notes.  Pertinent labs & imaging results that were available during  my care of the patient were reviewed by me and considered in my medical decision making (see chart for details).     Patient presents with significant dental infection.  He has swelling to the right lower jaw.  CT findings as noted above.  No significant findings that would necessitate inpatient management.  He is afebrile nontoxic.  Patient given a dose of antibiotics here.  Patient reports he is unable to afford the clindamycin, he will be started on Augmentin instead.  Patient will follow up with on-call oral surgeon, return immediately if any new or  worsening signs or symptoms present.  Patient verbalized understanding and agreement to today's plan had no further questions or concerns the time discharge.  Patient care was shared with attending physician who agreed to today's assessment and plan.  Final Clinical Impressions(s) / ED Diagnoses   Final diagnoses:  Dental infection    New Prescriptions Discharge Medication List as of 05/06/2017  4:05 PM    START taking these medications   Details  oxyCODONE-acetaminophen (PERCOCET/ROXICET) 5-325 MG tablet Take 1-2 tablets by mouth every 4 (four) hours as needed for severe pain., Starting Fri 05/06/2017, Print    amoxicillin-clavulanate (AUGMENTIN) 875-125 MG tablet Take 1 tablet by mouth every 12 (twelve) hours., Starting Fri 05/06/2017, Print         Theresa Dohrman, [place], PA-C 05/06/17 2316    Charlesetta Shanks, MD 05/15/17 [phone]

## 2017-05-06 NOTE — Discharge Instructions (Signed)
Please read attached information. If you experience any new or worsening signs or symptoms please return to the emergency room for evaluation. Please follow-up with oral surgeon immediately for reevaluation and further management.. Please use medication prescribed only as directed and discontinue taking if you have any concerning signs or symptoms.

## 2017-05-06 NOTE — ED Triage Notes (Signed)
Pt reports R lower jaw swelling. Has had problems with lower L teeth for some time.

## 2017-09-28 IMAGING — CT CT MAXILLOFACIAL W/ CM
3 series · 14 of 47 positions shown, 16 images · IV contrast (ISOVUE)
Comparison: Head and cervical spine CT

CLINICAL DATA: 46-year-old male with right lower jaw pain and
swelling for 3-4 days. Left lower dental problems.

EXAM:
CT MAXILLOFACIAL WITH CONTRAST
TECHNIQUE: Multidetector CT imaging of the maxillofacial structures was
performed with intravenous contrast. Multiplanar CT image
reconstructions were also generated.
CONTRAST:  75mL [id number] IOPAMIDOL (YV4KNG-I33) INJECTION 61%

[Series 3: facial st · axial · 0.41mm/px · z∈[-172,-2]mm · 8 of 99 slices shown, 10 images]
[im 7/99  brain]
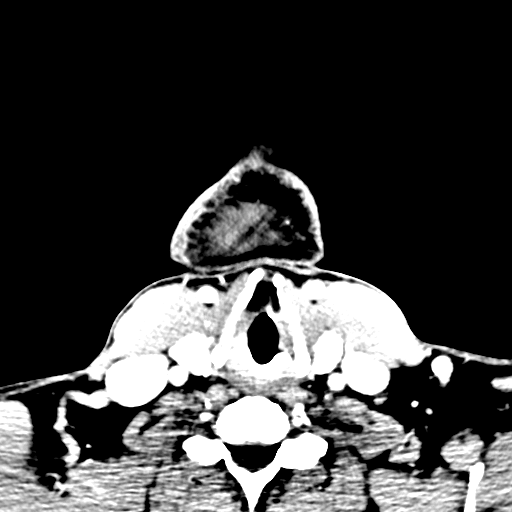
[im 7/99  bone]
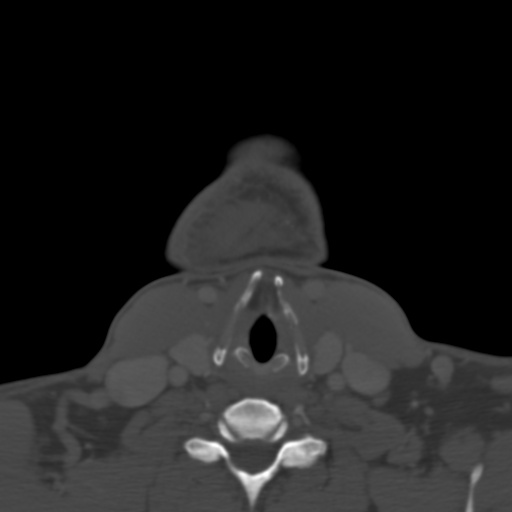
[im 21/99  bone]
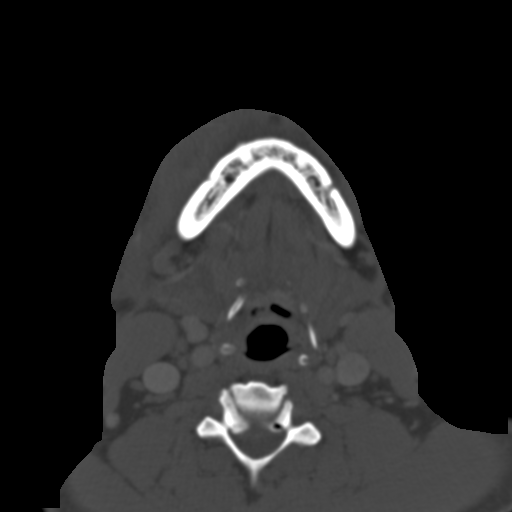
[im 31/99  bone]
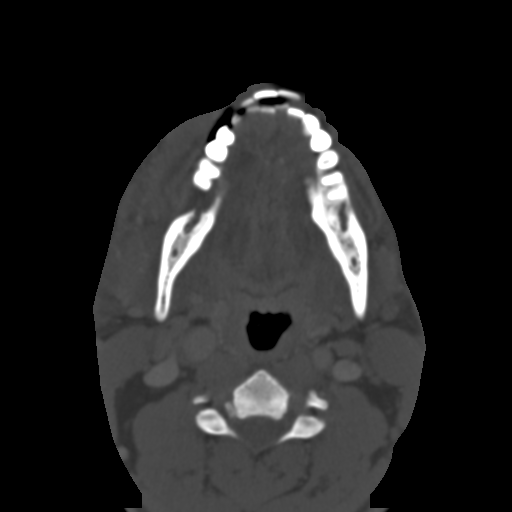
[im 44/99  bone]
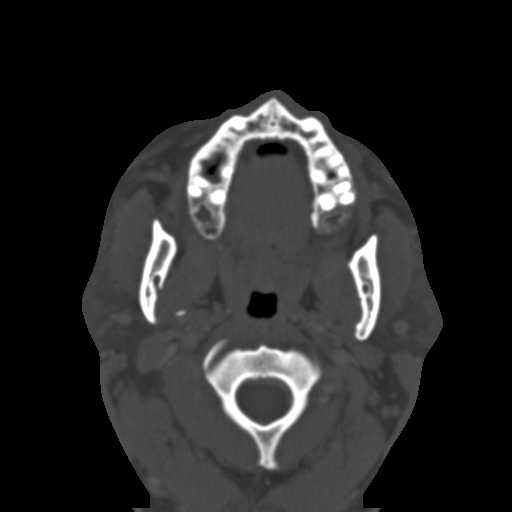
[im 55/99  brain]
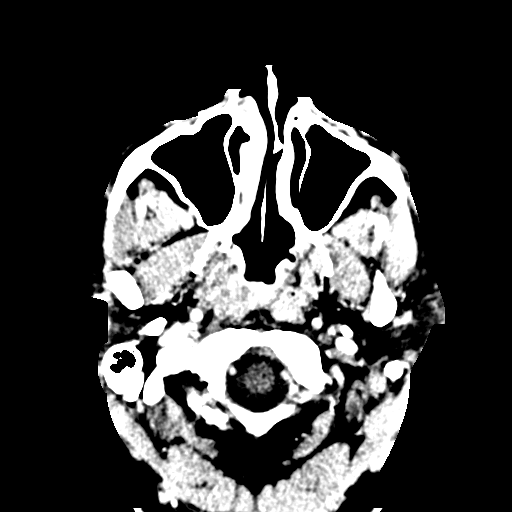
[im 55/99  bone]
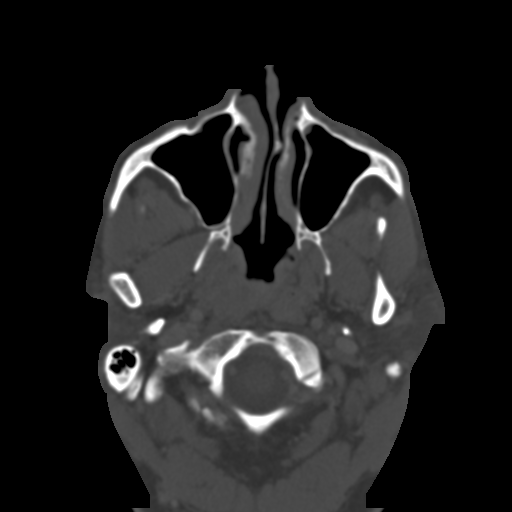
[im 68/99  bone]
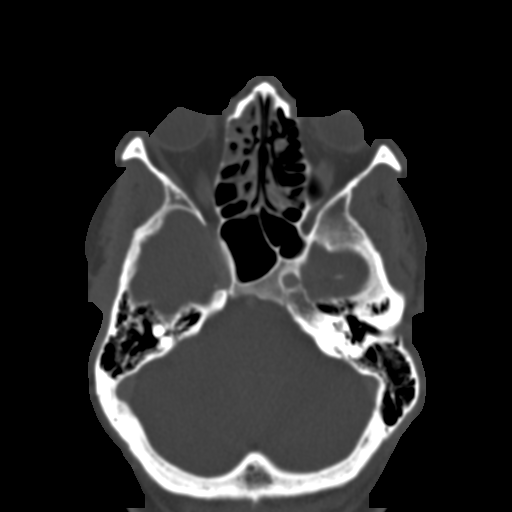
[im 78/99  bone]
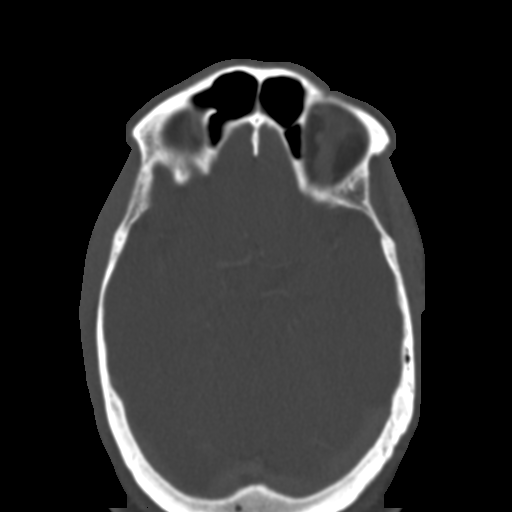
[im 92/99  bone]
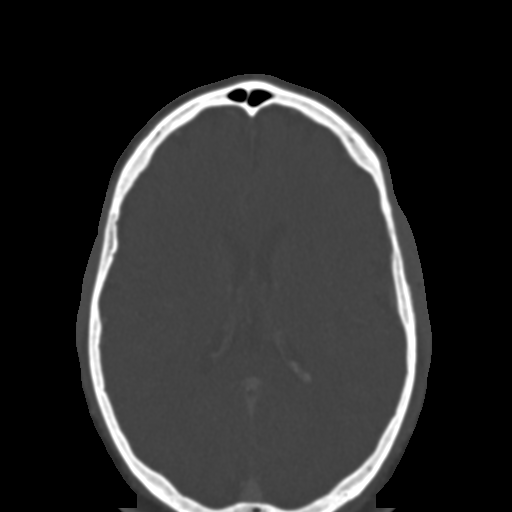

[Series 7: coronal st · coronal · 0.38mm/px · 3 of 100 slices shown]
[im 34/100  bone]
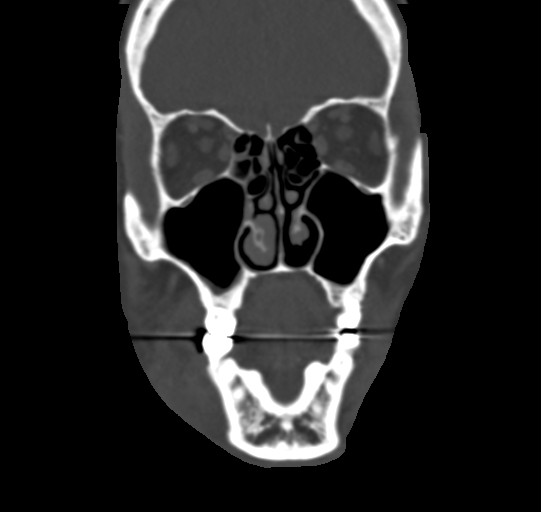
[im 45/100  bone]
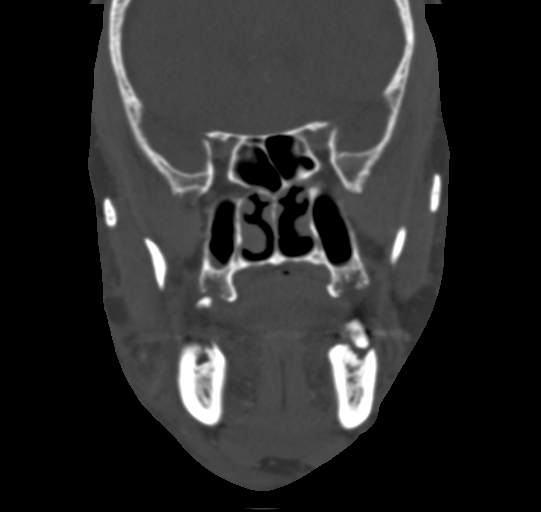
[im 56/100  bone]
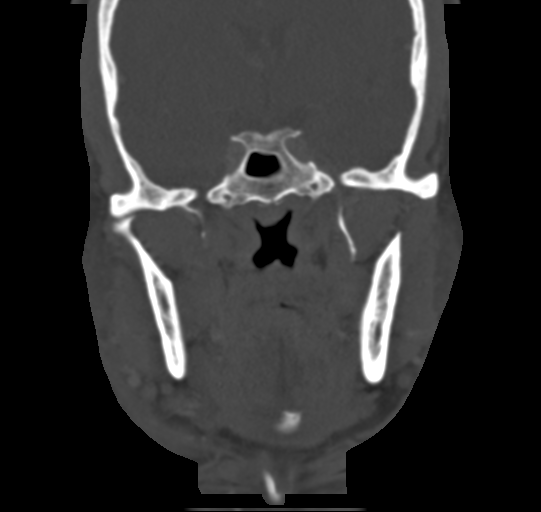

[Series 8: sagittal st · sagittal · 0.38mm/px · 3 of 94 slices shown]
[im 32/94  bone]
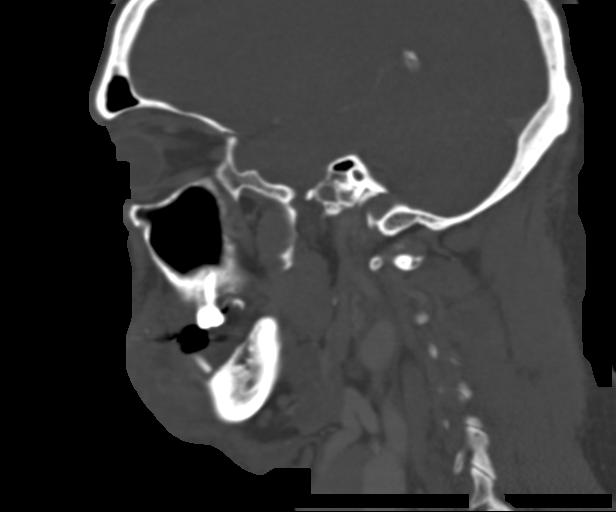
[im 47/94  bone]
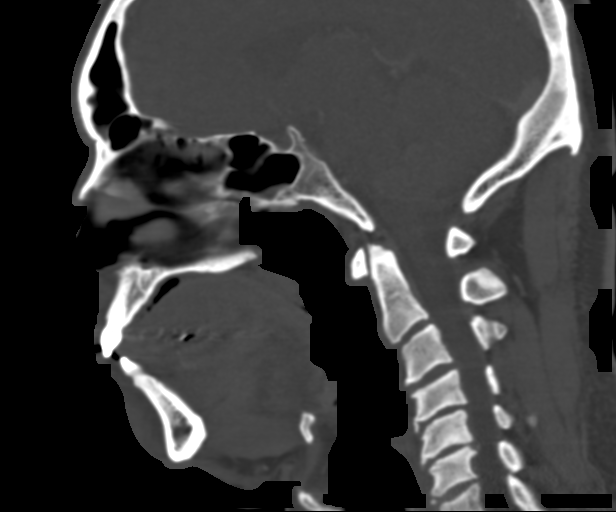
[im 63/94  bone]
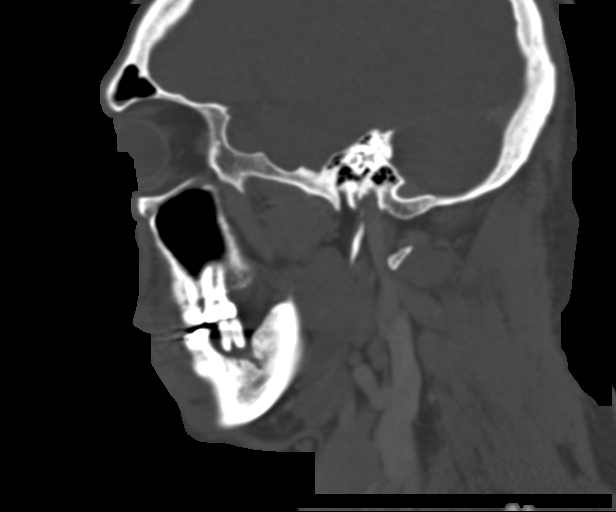

[14 of 47 positions shown; findings below may reference images not displayed]

FINDINGS: Osseous: Abnormal right mandible anterior molar, with periapical
lucency, and along the posterior root cortical breakthrough along
the superficial body of the mandible (series 4, image 28). Similar
poor dentition of the right maxillary wisdom tooth and contralateral
left mandible posterior molar.

Mandible otherwise intact. Other facial bones intact. Visible skull
and cervical spine intact. Multilevel cervical disc and endplate
degeneration.

Orbits: Orbital walls intact.  Normal orbits soft tissues.

Sinuses: Scattered mild to moderate ethmoid sinus mucosal
thickening. Trace maxillary and sphenoid sinus mucosal thickening
greater on the right. Bilateral tympanic cavities and mastoids are
clear.

Soft tissues: Moderate to severe soft tissue swelling and stranding
about the body of the right mandible. Regional subcutaneous fat
stranding. Involvement of the inferior right masticator space. The
sublingual and submandibular spaces appear spared although there is
asymmetric thickening of the right platysma, with fluid tracking
superficial to the platysma toward the submental space. A small
hyperenhancing phlegmonous suspected adjacent to the abnormal root
of the right mandible anterior molar, but no organized or drainable
fluid collection is identified. No subcutaneous gas.

Reactive appearing right level 1 and level 2 lymph nodes, mildly
enlarged and hyperenhancing (up to 12 mm short axis. No cystic or
necrotic nodes.

Major vascular structures in the neck including the right IJ remain
patent to the skullbase.

Negative visualized thyroid, larynx, pharynx, parapharyngeal spaces,
retropharyngeal space, left submandibular and parotid spaces.
Visualized scalp soft tissues are within normal limits.

Limited intracranial: Negative visualized brain parenchyma.
IMPRESSION: 1. Right facial cellulitis with odontogenic source of infection.
Abnormal residual right mandible molar. Associated soft tissue
phlegmon about the site of right mandible cortical breakthrough, but
no abscess or drainable fluid collection.
2. Involvement of the lower right masticator space, but sublingual
and other deep soft tissue spaces of the face are spared at this
time.
3. Reactive right level 1 and level 2 lymphadenopathy.

## 2020-06-17 ENCOUNTER — Other Ambulatory Visit: Payer: Self-pay

## 2020-06-17 ENCOUNTER — Other Ambulatory Visit: Payer: Self-pay | Admitting: Internal Medicine

## 2020-06-17 DIAGNOSIS — Z20822 Contact with and (suspected) exposure to covid-19: Secondary | ICD-10-CM

## 2020-06-19 LAB — NOVEL CORONAVIRUS, NAA: SARS-CoV-2, NAA: NOT DETECTED

## 2020-06-19 LAB — SARS-COV-2, NAA 2 DAY TAT

## 2020-07-27 ENCOUNTER — Emergency Department (HOSPITAL_COMMUNITY)
Admission: EM | Admit: 2020-07-27 | Discharge: 2020-07-27 | Disposition: A | Discharge status: Home / Self Care | Payer: Self-pay | Attending: Emergency Medicine | Admitting: Emergency Medicine

## 2020-07-27 ENCOUNTER — Encounter (HOSPITAL_COMMUNITY): Payer: Self-pay

## 2020-07-27 ENCOUNTER — Other Ambulatory Visit: Payer: Self-pay

## 2020-07-27 DIAGNOSIS — F17200 Nicotine dependence, unspecified, uncomplicated: Secondary | ICD-10-CM | POA: Insufficient documentation

## 2020-07-27 DIAGNOSIS — K047 Periapical abscess without sinus: Secondary | ICD-10-CM | POA: Insufficient documentation

## 2020-07-27 DIAGNOSIS — D171 Benign lipomatous neoplasm of skin and subcutaneous tissue of trunk: Secondary | ICD-10-CM | POA: Insufficient documentation

## 2020-07-27 MED ORDER — PENICILLIN V POTASSIUM 500 MG PO TABS: 500.0000 mg | ORAL_TABLET | Freq: Four times a day (QID) | ORAL | 0 refills | Status: AC

## 2020-07-27 MED ORDER — PENICILLIN V POTASSIUM 500 MG PO TABS
500.0000 mg | ORAL_TABLET | Freq: Once | ORAL | Status: AC
  Administered 2020-07-27: 500 mg via ORAL
  Filled 2020-07-27: qty 1

## 2020-07-27 NOTE — Discharge Instructions (Signed)
Take the prescribed medication as directed. Follow-up with dentist as soon as you can.  I have also attached dental resource guide. You can follow-up with general surgery about lipoma on your back if you want it removed. Return to the ED for new or worsening symptoms.

## 2020-07-27 NOTE — ED Triage Notes (Signed)
Pt reports right facial swelling. Dental decay right lower. Knot on lower back.

## 2020-07-27 NOTE — ED Notes (Signed)
PT complaining of pain/ swelling in right lower molar.  PT states that pain started 2 weeks ago and the swelling appeared this morning.  PT would also like a "lump" on his back looked at.

## 2020-07-27 NOTE — ED Provider Notes (Signed)
[facility] DEPT Provider Note   CSN: [id number] Arrival date & time: 07/27/20  0038     History Chief Complaint  Patient presents with  . Oral Swelling    Lawrence Larsen is a 49 y.o. male.  The history is provided by the patient and medical records.   49 y.o. M here with 2 complaints.  1.  Right facial swelling-- states right lower dental pain for the past few days but today woke up with swelling.  States history of same in the past related to broken tooth.  He denies difficulty swallowing, just trouble chewing on his right side.  Denies fever/chills.  He is not currently established with a dentist.  2.  Lump on lower back-- states present for several years.  Feels like it has gotten bigger lately and is starting to itch.  Denies redness/swelling.  No fever/chills.  Past Medical History:  Diagnosis Date  . Knee joint pain     There are no problems to display for this patient.   History reviewed. No pertinent surgical history.     No family history on file.  Social History   Tobacco Use  . Smoking status: Current Every Day Smoker    Packs/day: 0.50  . Smokeless tobacco: Never Used  Substance Use Topics  . Alcohol use: Yes    Alcohol/week: 3.0 standard drinks    Types: 3 Cans of beer per week    Comment: daily  . Drug use: Yes    Types: Marijuana    Comment: twice a month    Home Medications Prior to Admission medications   Medication Sig Start Date End Date Taking? Authorizing Provider  acetaminophen (TYLENOL) 500 MG tablet Take 1,500 mg by mouth every 6 (six) hours as needed. For pain     [provider]  amoxicillin-clavulanate (AUGMENTIN) 875-125 MG tablet Take 1 tablet by mouth every 12 (twelve) hours. 05/06/17   Hedges, Dellis Filbert, PA-C  naproxen (NAPROSYN) 500 MG tablet Take 1 tablet (500 mg total) by mouth 2 (two) times daily. 03/05/17   Fredia Sorrow, MD  oxyCODONE-acetaminophen (PERCOCET/ROXICET) 5-325 MG tablet  Take 1-2 tablets by mouth every 4 (four) hours as needed for severe pain. 05/06/17   Okey Regal, PA-C    Allergies    Patient has no known allergies.  Review of Systems   Review of Systems  HENT: Positive for dental problem.   All other systems reviewed and are negative.   Physical Exam Updated Vital Signs BP (!) 137/102 (BP Location: Left Arm)   Pulse 65   Temp 98.4 F (36.9 C) (Oral)   Resp 16   Ht 5\' 5"  (1.651 m)   Wt 84.8 kg   SpO2 100%   BMI 31.12 kg/m   Physical Exam Vitals and nursing note reviewed.  Constitutional:      Appearance: He is well-developed.  HENT:     Head: Normocephalic and atraumatic.     Mouth/Throat:      Comments: Teeth largely in fair dentition, right lower first molar decayed, surrounding gingiva swollen with extension to inner cheek, handling secretions appropriately, no trismus, mild swelling of right lower cheek without extension into neck, normal phonation without stridor Eyes:     Conjunctiva/sclera: Conjunctivae normal.     Pupils: Pupils are equal, round, and reactive to light.  Cardiovascular:     Rate and Rhythm: Normal rate and regular rhythm.     Heart sounds: Normal heart sounds.  Pulmonary:  Effort: Pulmonary effort is normal. No respiratory distress.     Breath sounds: Normal breath sounds. No rhonchi.  Abdominal:     General: Bowel sounds are normal.     Palpations: Abdomen is soft.  Musculoskeletal:        General: Normal range of motion.     Cervical back: Normal range of motion.       Back:  Skin:    General: Skin is warm and dry.  Neurological:     Mental Status: He is alert and oriented to person, place, and time.     ED Results / Procedures / Treatments   Labs (all labs ordered are listed, but only abnormal results are displayed) Labs Reviewed - No data to display  EKG None  Radiology No results found.  Procedures Procedures (including critical care time)  Medications Ordered in  ED Medications  penicillin v potassium (VEETID) tablet 500 mg (500 mg Oral Given 07/27/20 0529)    ED Course  I have reviewed the triage vital signs and the nursing notes.  Pertinent labs & imaging results that were available during my care of the patient were reviewed by me and considered in my medical decision making (see chart for details).    MDM Rules/Calculators/A&P  49 y.o. M here with 2 complaints.  1.  Right facial swelling-- dental pain for a few days, now swelling along right lower jaw.  Does have dental decay adjacent.  Swelling extending into cheek but not into neck.  No airway compromise, handling secretions well, no stridor.  Not clinically concerning for ludwig's angina at this time.  Will start on abx and refer to dentist for follow-up.  2.  Knot on lower back-- present for several years.  Feels larger now and is itching.  No redness or fever.  Appears to have lipoma on left lower back about the size of a kiwi.  No signs of superimposed infection/cellulitis.  Will refer to general surgery for follow-up.  Return here for any new/acute changes.  Final Clinical Impression(s) / ED Diagnoses Final diagnoses:  Dental abscess  Lipoma of back    Rx / DC Orders ED Discharge Orders         Ordered    penicillin v potassium (VEETID) 500 MG tablet  4 times daily        07/27/20 0538           Larene Pickett, PA-C 11/91/47 8295    Delora Fuel, MD 62/13/08 [phone]

## 2020-10-01 ENCOUNTER — Other Ambulatory Visit: Payer: Self-pay

## 2023-09-08 ENCOUNTER — Emergency Department (HOSPITAL_COMMUNITY): Payer: 59

## 2023-09-08 ENCOUNTER — Encounter (HOSPITAL_COMMUNITY): Payer: Self-pay

## 2023-09-08 ENCOUNTER — Emergency Department (HOSPITAL_COMMUNITY)
Admission: EM | Admit: 2023-09-08 | Discharge: 2023-09-08 | Disposition: A | Discharge status: Home / Self Care | Payer: 59 | Attending: Emergency Medicine | Admitting: Emergency Medicine

## 2023-09-08 DIAGNOSIS — D171 Benign lipomatous neoplasm of skin and subcutaneous tissue of trunk: Secondary | ICD-10-CM | POA: Insufficient documentation

## 2023-09-08 DIAGNOSIS — M1612 Unilateral primary osteoarthritis, left hip: Secondary | ICD-10-CM | POA: Insufficient documentation

## 2023-09-08 DIAGNOSIS — M25552 Pain in left hip: Secondary | ICD-10-CM | POA: Diagnosis present

## 2023-09-08 LAB — CBC WITH DIFFERENTIAL/PLATELET
Abs Immature Granulocytes: 0.02 10*3/uL (ref 0.00–0.07)
Basophils Absolute: 0.1 10*3/uL (ref 0.0–0.1)
Basophils Relative: 1 %
Eosinophils Absolute: 0.2 10*3/uL (ref 0.0–0.5)
Eosinophils Relative: 2 %
HCT: 39.9 % (ref 39.0–52.0)
Hemoglobin: 12.5 g/dL — ABNORMAL LOW (ref 13.0–17.0)
Immature Granulocytes: 0 %
Lymphocytes Relative: 49 %
Lymphs Abs: 4.8 10*3/uL — ABNORMAL HIGH (ref 0.7–4.0)
MCH: 24.1 pg — ABNORMAL LOW (ref 26.0–34.0)
MCHC: 31.3 g/dL (ref 30.0–36.0)
MCV: 77 fL — ABNORMAL LOW (ref 80.0–100.0)
Monocytes Absolute: 0.8 10*3/uL (ref 0.1–1.0)
Monocytes Relative: 9 %
Neutro Abs: 3.7 10*3/uL (ref 1.7–7.7)
Neutrophils Relative %: 39 %
Platelets: 294 10*3/uL (ref 150–400)
RBC: 5.18 MIL/uL (ref 4.22–5.81)
RDW: 15.4 % (ref 11.5–15.5)
WBC: 9.5 10*3/uL (ref 4.0–10.5)
nRBC: 0 % (ref 0.0–0.2)

## 2023-09-08 LAB — COMPREHENSIVE METABOLIC PANEL
ALT: 24 U/L (ref 0–44)
AST: 18 U/L (ref 15–41)
Albumin: 4.2 g/dL (ref 3.5–5.0)
Alkaline Phosphatase: 47 U/L (ref 38–126)
Anion gap: 5 (ref 5–15)
BUN: 14 mg/dL (ref 6–20)
CO2: 25 mmol/L (ref 22–32)
Calcium: 9.4 mg/dL (ref 8.9–10.3)
Chloride: 108 mmol/L (ref 98–111)
Creatinine, Ser: 0.76 mg/dL (ref 0.61–1.24)
GFR, Estimated: >60 mL/min (ref >60)
Glucose, Bld: 96 mg/dL (ref 70–99)
Potassium: 3.8 mmol/L (ref 3.5–5.1)
Sodium: 138 mmol/L (ref 135–145)
Total Bilirubin: 0.7 mg/dL (ref <1.2)
Total Protein: 7.7 g/dL (ref 6.5–8.1)

## 2023-09-08 MED ORDER — MELOXICAM 15 MG PO TABS: 15.0000 mg | ORAL_TABLET | Freq: Every day | ORAL | 0 refills | Status: AC

## 2023-09-08 NOTE — ED Notes (Signed)
Pt. Called for vital signs, no answer.

## 2023-09-08 NOTE — ED Provider Triage Note (Signed)
Emergency Medicine Provider Triage Evaluation Note  Lawrence Larsen , a 52 y.o. male  was evaluated in triage.  Pt complains of hip pain.  Reports that he has chronic hip pain on bilateral sides but worse on the left side.  States he has an area of swelling over the left hip side.  Denies any recent drainage or any other sort of irritation in the area but does state that it is feel somewhat itchy.  No recent fever, chills or bodyaches.   Review of Systems  Positive: As above Negative: As above  Physical Exam  BP 118/81 (BP Location: Left Arm)   Pulse 80   Temp 98.7 F (37.1 C) (Oral)   Resp 18   SpO2 100%  Gen:   Awake, no distress   Resp:  Normal effort  MSK:   Moves extremities without difficulty.  Crepitus in the left knee felt on palpation.  There is significant swelling overlying the left lumbosacral region and appears to be a possible abscess or other fluid collection.  States that this has been there for about 2 years or so with no prior drainage Other:    Medical Decision Making  Medically screening exam initiated at 5:25 PM.  Appropriate orders placed.  Lawrence Larsen was informed that the remainder of the evaluation will be completed by another provider, this initial triage assessment does not replace that evaluation, and the importance of remaining in the ED until their evaluation is complete.     Smitty Knudsen, PA-C 09/08/23 1726

## 2023-09-08 NOTE — Discharge Instructions (Signed)
You can follow up with [facility] (Dr. 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   You can follow up with Dr. Maisie Fus (general surgery) if you want to have the lipoma of your back removed.   Take Mobic daily as prescribed for joint pain.

## 2023-09-08 NOTE — ED Notes (Signed)
Discharge instructions reviewed with patient. Patient questions answered and opportunity for education reviewed. Patient voices understanding of discharge instructions with no further questions. Patient ambulatory with steady gait to lobby.  

## 2023-09-08 NOTE — ED Provider Notes (Signed)
[place] EMERGENCY DEPARTMENT AT [facility] Provider Note   CSN: [id number] Arrival date & time: 09/08/23  1551     History  Chief Complaint  Patient presents with   Hip Pain    Lawrence Larsen is a 52 y.o. male.  Patient to ED with complaint of bilateral knee pain and left > right hip pain "for a long time". No recent injury. No joint swelling or fever. No abdominal pain. He also reports a swollen area on the lower left back, also "for a long time".   The history is provided by the patient. No language interpreter was used.  Hip Pain  patinet     Home Medications Prior to Admission medications   Medication Sig Start Date End Date Taking? Authorizing Provider  meloxicam (MOBIC) 15 MG tablet Take 1 tablet (15 mg total) by mouth daily. 09/08/23  Yes Elpidio Anis, PA-C  acetaminophen (TYLENOL) 500 MG tablet Take 1,500 mg by mouth every 6 (six) hours as needed. For pain     [provider]  amoxicillin-clavulanate (AUGMENTIN) 875-125 MG tablet Take 1 tablet by mouth every 12 (twelve) hours. 05/06/17   Hedges, Tinnie Gens, PA-C  naproxen (NAPROSYN) 500 MG tablet Take 1 tablet (500 mg total) by mouth 2 (two) times daily. 03/05/17   Vanetta Mulders, MD  oxyCODONE-acetaminophen (PERCOCET/ROXICET) 5-325 MG tablet Take 1-2 tablets by mouth every 4 (four) hours as needed for severe pain. 05/06/17   Hedges, Tinnie Gens, PA-C  penicillin v potassium (VEETID) 500 MG tablet Take 1 tablet (500 mg total) by mouth 4 (four) times daily. 07/27/20   Garlon Hatchet, PA-C      Allergies    Patient has no known allergies.    Review of Systems   Review of Systems  Physical Exam Updated Vital Signs BP 107/85 (BP Location: Left Arm)   Pulse 62   Temp 98.3 F (36.8 C) (Oral)   Resp 18   SpO2 96%  Physical Exam Vitals and nursing note reviewed.  Constitutional:      Appearance: Normal appearance.  Cardiovascular:     Rate and Rhythm: Normal rate.     Pulses: Normal pulses.   Pulmonary:     Effort: Pulmonary effort is normal.  Musculoskeletal:     Comments: Knees bilaterally have no swelling, redness or warmth. Left hip tenderness with lateral palpation. FROM LE's. Ambulatory without limitation.   Skin:    General: Skin is warm and dry.     Findings: No erythema.     Comments: Soft, mobile mass in the left lower back without redness  Neurological:     Mental Status: He is alert and oriented to person, place, and time.     Motor: No weakness.     ED Results / Procedures / Treatments   Labs (all labs ordered are listed, but only abnormal results are displayed) Labs Reviewed  CBC WITH DIFFERENTIAL/PLATELET - Abnormal; Notable for the following components:      Result Value   Hemoglobin 12.5 (*)    MCV 77.0 (*)    MCH 24.1 (*)    Lymphs Abs 4.8 (*)    All other components within normal limits  COMPREHENSIVE METABOLIC PANEL   Results for orders placed or performed during the hospital encounter of 09/08/23  CBC with Differential   Collection Time: 09/08/23  9:39 PM  Result Value Ref Range   WBC 9.5 4.0 - 10.5 K/uL   RBC 5.18 4.22 - 5.81 MIL/uL   Hemoglobin  12.5 (L) 13.0 - 17.0 g/dL   HCT 16.1 09.6 - 04.5 %   MCV 77.0 (L) 80.0 - 100.0 fL   MCH 24.1 (L) 26.0 - 34.0 pg   MCHC 31.3 30.0 - 36.0 g/dL   RDW 40.9 81.1 - 91.4 %   Platelets 294 150 - 400 K/uL   nRBC 0.0 0.0 - 0.2 %   Neutrophils Relative % 39 %   Neutro Abs 3.7 1.7 - 7.7 K/uL   Lymphocytes Relative 49 %   Lymphs Abs 4.8 (H) 0.7 - 4.0 K/uL   Monocytes Relative 9 %   Monocytes Absolute 0.8 0.1 - 1.0 K/uL   Eosinophils Relative 2 %   Eosinophils Absolute 0.2 0.0 - 0.5 K/uL   Basophils Relative 1 %   Basophils Absolute 0.1 0.0 - 0.1 K/uL   Immature Granulocytes 0 %   Abs Immature Granulocytes 0.02 0.00 - 0.07 K/uL  Comprehensive metabolic panel   Collection Time: 09/08/23  9:39 PM  Result Value Ref Range   Sodium 138 135 - 145 mmol/L   Potassium 3.8 3.5 - 5.1 mmol/L   Chloride 108  98 - 111 mmol/L   CO2 25 22 - 32 mmol/L   Glucose, Bld 96 70 - 99 mg/dL   BUN 14 6 - 20 mg/dL   Creatinine, Ser 7.82 0.61 - 1.24 mg/dL   Calcium 9.4 8.9 - 95.6 mg/dL   Total Protein 7.7 6.5 - 8.1 g/dL   Albumin 4.2 3.5 - 5.0 g/dL   AST 18 15 - 41 U/L   ALT 24 0 - 44 U/L   Alkaline Phosphatase 47 38 - 126 U/L   Total Bilirubin 0.7 <1.2 mg/dL   GFR, Estimated >21 >30 mL/min   Anion gap 5 5 - 15     EKG None  Radiology DG Hip Unilat With Pelvis 2-3 Views Left Result Date: 09/08/2023 CLINICAL DATA:  Left hip pain. EXAM: DG HIP (WITH OR WITHOUT PELVIS) 2-3V LEFT COMPARISON:  None Available. FINDINGS: There is no acute fracture or dislocation. Severe arthritic changes of the left hip. The soft tissues are unremarkable. IMPRESSION: 1. No acute fracture or dislocation. 2. Severe arthritic changes of the left hip. Electronically Signed   By: Elgie Collard M.D.   On: 09/08/2023 18:17    Procedures Procedures    Medications Ordered in ED Medications - No data to display  ED Course/ Medical Decision Making/ A&P Clinical Course as of 09/08/23 2343  Thu Sep 08, 2023  2313 Patient to ED with chronic joint pain in the hips, L>R, and knees. No new symptoms. No recent injury. Imaging of the left hip showing arthritis. There is a soft mobile mass to the left lower back c/w lipoma given chronicity. VSS. Will start on Mobic, refer to orthopedics for severe arthritis of left hip, and to general surgery for consultation of lipoma removal.  [SU]    Clinical Course User Index [SU] Elpidio Anis, PA-C                                 Medical Decision Making          Final Clinical Impression(s) / ED Diagnoses Final diagnoses:  Arthritis of left hip  Lipoma of back    Rx / DC Orders ED Discharge Orders          Ordered    meloxicam (MOBIC) 15 MG tablet  Daily  09/08/23 2343              Elpidio Anis, PA-C 09/08/23 2343    Dione Booze, MD 09/16/23  2251

## 2023-09-08 NOTE — ED Triage Notes (Signed)
Pt presents with c/o left hip pain, chronic in nature. Pt reports he is here today because it is always bothering him and he says it is not getting any better. Pt denies any recent injury.
